# Patient Record
Sex: Male | Born: 1990 | Race: White | Hispanic: No | Marital: Married | Smoking: Current every day smoker
Health system: Southern US, Community
[De-identification: ages and names within clinical notes are randomized; demographics above are authoritative.]

## PROBLEM LIST (undated history)

## (undated) DIAGNOSIS — I10 Essential (primary) hypertension: Secondary | ICD-10-CM

## (undated) HISTORY — PX: WRIST SURGERY: SHX841

---
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
## 2011-03-27 ENCOUNTER — Encounter: Payer: Self-pay | Admitting: *Deleted

## 2011-03-27 DIAGNOSIS — E049 Nontoxic goiter, unspecified: Secondary | ICD-10-CM | POA: Insufficient documentation

## 2011-03-27 DIAGNOSIS — N62 Hypertrophy of breast: Secondary | ICD-10-CM | POA: Insufficient documentation

## 2011-04-20 ENCOUNTER — Ambulatory Visit: Payer: Self-pay | Admitting: "Endocrinology

## 2011-04-21 NOTE — Op Note (Signed)
[place]  Patient:    Daniel Ayers, Daniel Ayers Visit Number: [id number] MRN: [id number]          Service Type: DSU Location: [facility] Attending Physician:  Lucky Cowboy Dictated by:   Lucky Cowboy, M.D. Proc. Date: 08/02/01 Admit Date:  08/02/2001 Discharge Date: 08/02/2001   CC:         [facility]  Velvet Bathe, M.D.   Operative Report  PREOPERATIVE DIAGNOSIS:  Chronic otitis media with conductive hearing loss.  POSTOPERATIVE DIAGNOSIS: Chronic otitis media with conductive hearing loss. d  PROCEDURE:  Bilateral tympanotomy with tube placement.  SURGEON:  Lucky Cowboy, M.D.  ANESTHESIA:  General.  ESTIMATED BLOOD LOSS:  None.  COMPLICATIONS:  None.  INDICATIONS:  This patient is a 71-year-old male who has been diagnosed with ADHD.  In addition, he was found to have bilateral serous effusions with a moderate bilateral conductive hearing loss.  The air-bone gap was 30-40 dB. For these reasons, tympanotomy tubes are placed.  Central auditory processing testing will be deferred until resolution of the hearing test.  FINDINGS:  The patient was noted to have bilateral serous effusions with moderately retracted tympanic membranes and significant middle ear mucosal edema.  Activent 1.14 mm ID tubes were placed bilaterally.  DESCRIPTION OF PROCEDURE:  The patient was taken to the operating room and placed on the table in the supine position.  He was then placed under general mask anesthesia and a #4 ear speculum placed into the right external auditory canal.  With the aid of the operating microscope, cerumen was removed with the curet and suctioned.  Myringotomy knife was used to make an incision in the anterior inferior quadrant.  Middle ear fluid was evacuated and Activent tube placed through the tympanic membrane and secured in place with a pick.  Floxin otic drops were instilled.  Attention was then turned to the left ear.   In similar fashion, the cerumen was removed.  Myringotomy knife was used to make an incision in the anterior inferior quadrant.  Middle ear fluid was evacuated and Activent tube placed through the tympanic membrane and secured in place with a pick.  Floxin otic drops were instilled.  The patient was awakened from anesthesia and taken to the post anesthesia care unit in stable condition. There were no complications. Dictated by:   Lucky Cowboy, M.D. Attending Physician:  Lucky Cowboy DD:  08/02/01 TD:  08/02/01 Job: [id number]

## 2011-09-14 LAB — POCT RAPID STREP A: Streptococcus, Group A Screen (Direct): NEGATIVE

## 2017-03-27 DIAGNOSIS — R109 Unspecified abdominal pain: Secondary | ICD-10-CM | POA: Diagnosis not present

## 2019-11-06 ENCOUNTER — Other Ambulatory Visit: Payer: Self-pay

## 2019-11-06 DIAGNOSIS — Z20822 Contact with and (suspected) exposure to covid-19: Secondary | ICD-10-CM

## 2019-11-08 LAB — NOVEL CORONAVIRUS, NAA: SARS-CoV-2, NAA: NOT DETECTED

## 2019-12-15 ENCOUNTER — Other Ambulatory Visit: Payer: Self-pay

## 2019-12-15 ENCOUNTER — Emergency Department (HOSPITAL_COMMUNITY): Payer: 59

## 2019-12-15 ENCOUNTER — Emergency Department (HOSPITAL_COMMUNITY)
Admission: EM | Admit: 2019-12-15 | Discharge: 2019-12-15 | Disposition: A | Discharge status: Home / Self Care | Payer: 59 | Attending: Emergency Medicine | Admitting: Emergency Medicine

## 2019-12-15 ENCOUNTER — Encounter (HOSPITAL_COMMUNITY): Payer: Self-pay | Admitting: Emergency Medicine

## 2019-12-15 DIAGNOSIS — R05 Cough: Secondary | ICD-10-CM | POA: Insufficient documentation

## 2019-12-15 DIAGNOSIS — I10 Essential (primary) hypertension: Secondary | ICD-10-CM | POA: Insufficient documentation

## 2019-12-15 DIAGNOSIS — M549 Dorsalgia, unspecified: Secondary | ICD-10-CM

## 2019-12-15 DIAGNOSIS — M546 Pain in thoracic spine: Secondary | ICD-10-CM | POA: Insufficient documentation

## 2019-12-15 DIAGNOSIS — F17210 Nicotine dependence, cigarettes, uncomplicated: Secondary | ICD-10-CM | POA: Diagnosis not present

## 2019-12-15 HISTORY — DX: Essential (primary) hypertension: I10

## 2019-12-15 MED ORDER — METHOCARBAMOL 500 MG PO TABS: 500.0000 mg | ORAL_TABLET | Freq: Two times a day (BID) | ORAL | 0 refills | Status: AC | PRN

## 2019-12-15 MED ORDER — NAPROXEN 500 MG PO TABS
500.0000 mg | ORAL_TABLET | Freq: Two times a day (BID) | ORAL | 0 refills | Status: AC
Start: 2019-12-15 — End: ?

## 2019-12-15 MED ORDER — IBUPROFEN 600 MG PO TABS: 600.0000 mg | ORAL_TABLET | Freq: Four times a day (QID) | ORAL | 0 refills | Status: AC | PRN

## 2019-12-15 NOTE — ED Provider Notes (Signed)
[facility] EMERGENCY DEPARTMENT Provider Note   CSN: [id number] Arrival date & time: 12/15/19  2426     History Chief Complaint  Patient presents with  . Back Pain    Daniel Ayers is a 29 y.o. male.  HPI 29 year old male, presents with back pain which is been going on for approximately 4 days.  He denies any prior history of back injuries or problems, denies any current fevers and denies any neurologic symptoms.  He reports that he has been coughing for approximately 1 month intermittently, had some allergic rhinitis type symptoms, sneezing, coughing, seems to come and go but still present.  He does have known allergies.  He denies any prior problems with IV drug use, no recent tattoos, denies history of cancer, denies numbness or weakness of the legs, denies urinary symptoms including hematuria dysuria.  Has been applying warm compresses at his wife's request.   Past Medical History:  Diagnosis Date  . Hypertension     Patient Active Problem List   Diagnosis Date Noted  . Hypertrophy of breast 03/27/2011  . Goiter, unspecified 03/27/2011    Past Surgical History:  Procedure Laterality Date  . WRIST SURGERY         History reviewed. No pertinent family history.  Social History   Tobacco Use  . Smoking status: Current Every Day Smoker    Types: Cigarettes  . Smokeless tobacco: Never Used  Substance Use Topics  . Alcohol use: Yes    Alcohol/week: 6.0 standard drinks    Types: 6 Cans of beer per week  . Drug use: Never    Home Medications Prior to Admission medications   Medication Sig Start Date End Date Taking? Authorizing Provider  ibuprofen (ADVIL) 600 MG tablet Take 1 tablet (600 mg total) by mouth every 6 (six) hours as needed. 12/15/19   Noemi Chapel, MD  methocarbamol (ROBAXIN) 500 MG tablet Take 1 tablet (500 mg total) by mouth 2 (two) times daily as needed for muscle spasms. 12/15/19   Noemi Chapel, MD  naproxen (NAPROSYN) 500 MG tablet Take 1  tablet (500 mg total) by mouth 2 (two) times daily with a meal. 12/15/19   Noemi Chapel, MD    Allergies    Patient has no known allergies.  Review of Systems   Review of Systems  Constitutional: Negative for fever.  Respiratory: Positive for cough.   Neurological: Negative for weakness and numbness.    Physical Exam Updated Vital Signs BP 126/90 (BP Location: Right Arm)   Pulse 76   Temp 98.1 F (36.7 C) (Oral)   Resp 20   Ht 1.778 m (5\' 10" )   Wt 90.7 kg   SpO2 99%   BMI 28.70 kg/m   Physical Exam Vitals and nursing note reviewed.  Constitutional:      General: He is not in acute distress.    Appearance: He is well-developed.  HENT:     Head: Normocephalic and atraumatic.     Mouth/Throat:     Pharynx: No oropharyngeal exudate.  Eyes:     General: No scleral icterus.       Right eye: No discharge.        Left eye: No discharge.     Conjunctiva/sclera: Conjunctivae normal.     Pupils: Pupils are equal, round, and reactive to light.  Neck:     Thyroid: No thyromegaly.     Vascular: No JVD.  Cardiovascular:     Rate and Rhythm: Normal rate and  regular rhythm.     Heart sounds: Normal heart sounds. No murmur. No friction rub. No gallop.   Pulmonary:     Effort: Pulmonary effort is normal. No respiratory distress.     Breath sounds: Normal breath sounds. No wheezing or rales.     Comments: Rales at the right base that clear with coughing, otherwise clear and no respiratory distress, no increased work of breathing, speaks in full sentences Abdominal:     General: Bowel sounds are normal. There is no distension.     Palpations: Abdomen is soft. There is no mass.     Tenderness: There is no abdominal tenderness.  Musculoskeletal:        General: No tenderness. Normal range of motion.     Cervical back: Normal range of motion and neck supple.     Comments: No spinal tenderness, mild right CVA tenderness  Lymphadenopathy:     Cervical: No cervical adenopathy.    Skin:    General: Skin is warm and dry.     Findings: No erythema or rash.  Neurological:     Mental Status: He is alert.     Coordination: Coordination normal.     Comments: Speech is clear, cranial nerves III through XII are intact, memory is intact, strength is normal in all 4 extremities including grips, sensation is intact to light touch and pinprick in all 4 extremities. Coordination as tested by finger-nose-finger is normal, no limb ataxia. Normal gait, normal reflexes at the patellar tendons bilaterally  Psychiatric:        Behavior: Behavior normal.     ED Results / Procedures / Treatments   Labs (all labs ordered are listed, but only abnormal results are displayed) Labs Reviewed - No data to display  EKG None  Radiology DG Chest 2 View  Result Date: 12/15/2019 CLINICAL DATA:  Cough EXAM: CHEST - 2 VIEW COMPARISON:  None. FINDINGS: Lungs are clear. Heart size and pulmonary vascularity are normal. No adenopathy. No bone lesions. IMPRESSION: Lungs clear.  No adenopathy. Electronically Signed   By: Bretta Bang III M.D.   On: 12/15/2019 07:57    Procedures Procedures (including critical care time)  Medications Ordered in ED Medications - No data to display  ED Course  I have reviewed the triage vital signs and the nursing notes.  Pertinent labs & imaging results that were available during my care of the patient were reviewed by me and considered in my medical decision making (see chart for details).    MDM Rules/Calculators/A&P                      No neurologic findings, this patient has likely nonpathological back pain.  X-ray of the chest I have personally viewed and interpreted, 2 view PA and lateral with normal lung windows, there is no signs of infiltrate pneumothorax subdiaphragmatic air or abnormal mediastinum.  This is a normal-appearing x-ray.  This does not appear to be pneumonia, it does not appear to be cauda equina or other spinal pathology, no  risk for epidural abscess and he is afebrile.  We will treat with medications such as ibuprofen and Robaxin, the patient can follow-up in the outpatient setting  The patient was instructed on indications for follow-up, treatment plan, stable for discharge  Final Clinical Impression(s) / ED Diagnoses Final diagnoses:  Mid back pain on right side    Rx / DC Orders ED Discharge Orders  Ordered    naproxen (NAPROSYN) 500 MG tablet  2 times daily with meals     12/15/19 0802    ibuprofen (ADVIL) 600 MG tablet  Every 6 hours PRN     12/15/19 0802    methocarbamol (ROBAXIN) 500 MG tablet  2 times daily PRN     12/15/19 0804           Eber Hong, MD 12/15/19 [phone]

## 2019-12-15 NOTE — ED Triage Notes (Signed)
C/o mid back pain.  Coughing (non-productive) for one month.  Tested negative for COVID one month ago.  Rates pain 2/10 sitting, but increases to 7/10 with movement.  Denies any injury.

## 2019-12-15 NOTE — Discharge Instructions (Signed)
Back Pain: ° ° °Your back pain should be treated with medicines such as ibuprofen or aleve and this back pain should get better over the next 2 weeks.  However if you develop severe or worsening pain, low back pain with fever, numbness, weakness or inability to walk or urinate, you should return to the ER immediately.  Please follow up with your doctor this week for a recheck if still having symptoms. °Low back pain is discomfort in the lower back that may be due to injuries to muscles and ligaments around the spine.  Occasionally, it may be caused by a a problem to a part of the spine called a disc.  The pain may last several days or a week;  However, most patients get completely well in 4 weeks. ° °Self - care:  The application of heat can help soothe the pain.  Maintaining your daily activities, including walking, is encourged, as it will help you get better faster than just staying in bed. ° °Medications are also useful to help with pain control.  A commonly prescribed medications includes acetaminophen.  This medication is generally safe, though you should not take more than 8 of the extra strength (500mg) pills a day. ° °Non steroidal anti inflammatory medications including Ibuprofen and naproxen;  These medications help both pain and swelling and are very useful in treating back pain.  They should be taken with food, as they can cause stomach upset, and more seriously, stomach bleeding.   ° °Muscle relaxants:  These medications can help with muscle tightness that is a cause of lower back pain.  Most of these medications can cause drowsiness, and it is not safe to drive or use dangerous machinery while taking them. ° °You will need to follow up with  Your primary healthcare provider in 1-2 weeks for reassessment. ° °Be aware that if you develop new symptoms, such as a fever, leg weakness, difficulty with or loss of control of your urine or bowels, abdominal pain, or more severe pain, you will need to seek  medical attention and  / or return to the Emergency department. ° °If you do not have a doctor see the list below. ° °RESOURCE GUIDE ° °Chronic Pain Problems: °Contact [facility]  297-2271 °Patients need to be referred by their primary care doctor. ° °Insufficient Money for Medicine: °Contact [employer] Way:  call "211" or [facility] 271-5999. ° °No Primary Care Doctor: °- Call [facility]  832-8000 - can help you locate a primary care doctor that  accepts your insurance, provides certain services, etc. °- Physician Referral Service- 1-800-533-3463 ° °Agencies that provide inexpensive medical care: °- [facility]  832-8035 °- [facility]  832-7272 °- [facility]  [phone] °- [facility]  832-4777 °- [facility]  373-0678 °- [facility]  272-1050 ° °Medicaid-accepting [place] Providers: °- [facility]- [address] ° [phone], Mon-Fri 9am-7pm, Sat 9am-1pm °- [facility]- [address], [address] ° [phone] °- [facility]- [address], [address] ° [phone] °- [facility]- [address] [place] [address] ° [phone] °- [facility]- [address], [address], [phone] ° Only accepts [place] Access Medicaid patients after they have their name  applied to their card ° °Self Pay (no insurance) in [place]: °- Sickle Cell Patients: Dr Eric Dean, [facility] °   [address], [phone] °- [facility]- [address] ° [phone] °      -     [facility] [facility]- [address] °      -     [facility]- see information above (Speak to Pam H if you do not have insurance) °      -  [facility]- [address], [phone] °      -  [facility] [place]- [address],  [phone] °      -  [facility]- [address] [place] [address],  [phone] °      -  Dr Osei-Bonsu-  [address], [place], [phone] °      -  [facility]- [address], [phone] °      -  [facility]- [address] [place] [address], [phone], also [address], [phone] °      -    [facility]- [address], [phone], 1st & 3rd Saturday   every month, 10am-1pm ° °1) Find a Doctor and Pay Out of Pocket °Although you won't have to find out who is covered by your insurance plan, it is a good idea to ask around and get recommendations. You will then need to call the office and see if the doctor you have chosen will accept you as a new patient and what types of options they offer for patients who are self-pay. Some doctors offer discounts or will set up payment plans for their patients who do not have insurance, but you will need to ask so you aren't surprised when you get to your appointment. ° °2) Contact Your Local Health Department °Not all health departments have doctors that can see patients for sick visits, but many do, so it is worth a call to see if yours does. If you don't know where your local health department is, you can check in your phone book. The CDC also has a tool to help you locate your state's health department, and many state websites also have listings of all of their local health departments. ° °3) Find a Walk-in Clinic °If your illness is not likely to be very severe or complicated, you may want to try a walk in clinic. These are popping up all over the country in pharmacies, drugstores, and shopping centers. They're usually staffed by nurse practitioners or physician assistants that have been trained to treat common illnesses and complaints. They're usually fairly quick and inexpensive. However, if you have serious medical issues or chronic medical problems, these are probably not your best option ° °STD Testing °- [place] [facility] [place], [facility], [address], [place], phone [phone] or 1-877-539-9860.  Monday - Friday, call for an appointment. °- [place] [facility] [place], [facility], [address], [place], phone [phone] or 1-877-539-9860.  Monday - Friday, call for an appointment. ° °Abuse/Neglect: °- [place] Child Abuse Hotline (336) 641-3795 °- [place] Child Abuse Hotline 800-378-5315 (After Hours) ° °Emergency Shelter:  [facility] (336) 271-5985 ° °Maternity Homes: °- [facility] (336) 275-9566 °- [facility] (704) 372-4663 ° °MRSA Hotline #:   832-7006 ° °[place] Resources ° °[facility] [place]  [employer] [place] [facility] °[address]                 [address]         [address]  °[place]                                               [place]                              [place] °Phone:  349-3220                                  Phone:  [phone]                   Phone:  342-8140 ° °[facility], 342-8316 °- [place] [facility] - [facility]- 1-888-581-9988 °      -     [facility] in [place], [address],                                  336-349-4454, Insurance ° °[place] Child Abuse Hotline °(336) 342-1394 or (336) 342-3537 (After Hours) ° ° °Behavioral Health Services ° °Substance Abuse Resources: °- [facility]  336-882-2125 °- [facility] 336-784-9470 °- [facility] 336-285-9073 °- [facility] 336-845-3988 °- Residential & Outpatient Substance Abuse Program  800-659-3381 ° °Psychological Services: °- [facility]  832-9600 °- [facility]  378-7881 °- [facility], [address], [place], ACCESS LINE: 1-800-853-5163 or 336-641-4981, Http://www.[facility].com/services/adult.htm ° °Dental Assistance ° °If unable to pay or  uninsured, contact:  [facility] or [facility] to become qualified for the adult dental clinic. ° °Patients with Medicaid: [facility] °[address], [phone] °[address], [phone] ° °If unable to pay, or uninsured, contact [facility] (271-5999) or [place] [facility] (641-3152 in [place], 842-7733 in [place]) to become qualified for the adult dental clinic ° °Other Low-Cost Community Dental Services: °- [facility]- [address], [place], [place], [address], 723-1848, Ext. [address], 2nd and 4th Thursday of the month at 6:30am.  10 clients each day by appointment, can sometimes see walk-in patients if someone does not show for an appointment. °- [facility]- [address], [place], [place], [address], 723-7904 °- [facility]- [address], [place], [place], [address], 631-2330 °- [facility]- 342-8273 °- [facility]- 703-3100 °- [facility]- 570-6415 ° ° ° ° ° ° °

## 2020-01-06 ENCOUNTER — Other Ambulatory Visit: Payer: Self-pay | Admitting: Physician Assistant

## 2020-01-06 DIAGNOSIS — N62 Hypertrophy of breast: Secondary | ICD-10-CM

## 2020-01-14 ENCOUNTER — Other Ambulatory Visit: Payer: 59

## 2020-01-27 ENCOUNTER — Other Ambulatory Visit: Payer: 59

## 2020-03-04 ENCOUNTER — Ambulatory Visit (INDEPENDENT_AMBULATORY_CARE_PROVIDER_SITE_OTHER): Payer: 59

## 2020-03-04 ENCOUNTER — Other Ambulatory Visit: Payer: Self-pay

## 2020-03-04 ENCOUNTER — Ambulatory Visit
Admission: EM | Admit: 2020-03-04 | Discharge: 2020-03-04 | Disposition: A | Discharge status: Home / Self Care | Payer: 59 | Attending: Emergency Medicine | Admitting: Emergency Medicine

## 2020-03-04 DIAGNOSIS — M549 Dorsalgia, unspecified: Secondary | ICD-10-CM | POA: Diagnosis not present

## 2020-03-04 DIAGNOSIS — W07XXXA Fall from chair, initial encounter: Secondary | ICD-10-CM | POA: Diagnosis not present

## 2020-03-04 DIAGNOSIS — R0781 Pleurodynia: Secondary | ICD-10-CM | POA: Diagnosis not present

## 2020-03-04 NOTE — ED Triage Notes (Signed)
Pt fell on left ribs last week, pain with inspiration

## 2020-03-04 NOTE — Discharge Instructions (Addendum)
Your costovertebral angle pain, chest pain are more related to the fall Patient was advised to use OTC Tylenol/ibuprofen for pain management To return or go to ED for new or worsening symptoms

## 2020-03-04 NOTE — ED Provider Notes (Signed)
[facility]    CSN: [id number] Arrival date & time: 03/04/20  0854      History   Chief Complaint No chief complaint on file.   HPI Daniel Ayers is a 29 y.o. male.   Who presented to the urgent care with a complaint of left rib cage pain for the past week.  Report he fell from a 3 foot high chair and landed on his left side around his rib cage.  Localized the pain to the left costovertebral angle and chest.  He describes the pain as constant and achy and rated at 5 on inspiration.  Has not tried any OTC medication.  His symptoms are made worse with breathing.  He denies similar symptoms in the past.  Denies chills, fever, nausea, vomiting, diarrhea, shortness of breath, bloody sputum, chest tightness.   The history is provided by the patient. No language interpreter was used.    Past Medical History:  Diagnosis Date  . Hypertension     Patient Active Problem List   Diagnosis Date Noted  . Hypertrophy of breast 03/27/2011  . Goiter, unspecified 03/27/2011    Past Surgical History:  Procedure Laterality Date  . WRIST SURGERY         Home Medications    Prior to Admission medications   Medication Sig Start Date End Date Taking? Authorizing Provider  ibuprofen (ADVIL) 600 MG tablet Take 1 tablet (600 mg total) by mouth every 6 (six) hours as needed. 12/15/19   Eber Hong, MD  methocarbamol (ROBAXIN) 500 MG tablet Take 1 tablet (500 mg total) by mouth 2 (two) times daily as needed for muscle spasms. 12/15/19   Eber Hong, MD  naproxen (NAPROSYN) 500 MG tablet Take 1 tablet (500 mg total) by mouth 2 (two) times daily with a meal. 12/15/19   Eber Hong, MD    Family History Family History  Family history unknown: Yes    Social History Social History   Tobacco Use  . Smoking status: Current Every Day Smoker    Types: Cigarettes  . Smokeless tobacco: Never Used  Substance Use Topics  . Alcohol use: Yes    Alcohol/week: 6.0 standard drinks   Types: 6 Cans of beer per week  . Drug use: Never     Allergies   Patient has no known allergies.   Review of Systems Review of Systems  Constitutional: Negative.   Respiratory: Negative.   Cardiovascular: Positive for chest pain.       Costovertebral angle pain   Gastrointestinal: Negative.   Genitourinary: Positive for flank pain.  All other systems reviewed and are negative.    Physical Exam Triage Vital Signs ED Triage Vitals [03/04/20 0903]  Enc Vitals Group     BP 122/76     Pulse Rate 75     Resp 17     Temp 98.2 F (36.8 C)     Temp Source Oral     SpO2 98 %     Weight      Height      Head Circumference      Peak Flow      Pain Score      Pain Loc      Pain Edu?      Excl. in GC?    No data found.  Updated Vital Signs BP 122/76 (BP Location: Right Arm)   Pulse 75   Temp 98.2 F (36.8 C) (Oral)   Resp 17   SpO2  98%   Visual Acuity Right Eye Distance:   Left Eye Distance:   Bilateral Distance:    Right Eye Near:   Left Eye Near:    Bilateral Near:     Physical Exam Vitals and nursing note reviewed.  Constitutional:      General: He is not in acute distress.    Appearance: Normal appearance. He is normal weight. He is not ill-appearing or toxic-appearing.  HENT:     Head: Normocephalic.     Right Ear: Tympanic membrane, ear canal and external ear normal. There is no impacted cerumen.     Left Ear: Tympanic membrane, ear canal and external ear normal. There is no impacted cerumen.  Cardiovascular:     Rate and Rhythm: Normal rate and regular rhythm.     Pulses: Normal pulses.     Heart sounds: Normal heart sounds. No murmur. No gallop.   Pulmonary:     Effort: Pulmonary effort is normal. No respiratory distress.     Breath sounds: Normal breath sounds. No stridor. No wheezing, rhonchi or rales.  Chest:     Chest wall: No tenderness.  Abdominal:     General: Abdomen is flat. There is no distension.     Palpations: Abdomen is soft.  There is no mass.     Tenderness: There is no abdominal tenderness. There is left CVA tenderness. There is no right CVA tenderness.  Musculoskeletal:        General: Tenderness present. Normal range of motion.  Neurological:     Mental Status: He is alert.      UC Treatments / Results  Labs (all labs ordered are listed, but only abnormal results are displayed) Labs Reviewed - No data to display  EKG   Radiology DG Ribs Unilateral W/Chest Left  Result Date: 03/04/2020 CLINICAL DATA:  Larey Seat 1 week ago and injured left ribs. Persistent pain. EXAM: LEFT RIBS AND CHEST - 3+ VIEW COMPARISON:  None. FINDINGS: The cardiac silhouette, mediastinal and hilar contours are within normal limits. The lungs are clear. No pleural effusion or pneumothorax. Suspect subtle nondisplaced fracture involving the eighth anterior rib. No other definite rib fractures are identified. IMPRESSION: Suspect subtle nondisplaced left eighth anterior rib fracture. No acute cardiopulmonary findings. Electronically Signed   By: Rudie Meyer M.D.   On: 03/04/2020 09:27    Procedures Procedures (including critical care time)  Medications Ordered in UC Medications - No data to display  Initial Impression / Assessment and Plan / UC Course  I have reviewed the triage vital signs and the nursing notes.  Pertinent labs & imaging results that were available during my care of the patient were reviewed by me and considered in my medical decision making (see chart for details).     Patient is stable at discharge.  Chest x-ray is negative for bony abnormality including fracture or dislocation.  I have reviewed the x-ray myself and the radiologist interpretation.  I am in agreement with the radiologist interpretation.  Costovertebral angle pain, chest pain and flank pain are more related to the fall.  Patient was advised to take ibuprofen for pain management. To return or go to ED for worsening of symptoms  Final Clinical  Impressions(s) / UC Diagnoses   Final diagnoses:  Pain of costovertebral angle  Fall from chair, initial encounter     Discharge Instructions     Positive vertebral angle pain, chest pain are more related to the fall Patient was advised to use OTC  Tylenol/ibuprofen for pain management To return or go to ED for new or worsening symptoms    ED Prescriptions    None     PDMP not reviewed this encounter.   Emerson Monte, [place] 03/04/20 [phone]

## 2020-05-29 ENCOUNTER — Emergency Department (HOSPITAL_COMMUNITY): Payer: 59

## 2020-05-29 ENCOUNTER — Emergency Department (HOSPITAL_COMMUNITY)
Admission: EM | Admit: 2020-05-29 | Discharge: 2020-05-29 | Disposition: A | Discharge status: Home / Self Care | Payer: 59 | Attending: Emergency Medicine | Admitting: Emergency Medicine

## 2020-05-29 ENCOUNTER — Other Ambulatory Visit: Payer: Self-pay

## 2020-05-29 ENCOUNTER — Encounter (HOSPITAL_COMMUNITY): Payer: Self-pay

## 2020-05-29 DIAGNOSIS — Y9289 Other specified places as the place of occurrence of the external cause: Secondary | ICD-10-CM | POA: Insufficient documentation

## 2020-05-29 DIAGNOSIS — T2027XA Burn of second degree of neck, initial encounter: Secondary | ICD-10-CM | POA: Diagnosis not present

## 2020-05-29 DIAGNOSIS — T2020XA Burn of second degree of head, face, and neck, unspecified site, initial encounter: Secondary | ICD-10-CM | POA: Diagnosis not present

## 2020-05-29 DIAGNOSIS — I10 Essential (primary) hypertension: Secondary | ICD-10-CM | POA: Diagnosis not present

## 2020-05-29 DIAGNOSIS — F17210 Nicotine dependence, cigarettes, uncomplicated: Secondary | ICD-10-CM | POA: Insufficient documentation

## 2020-05-29 DIAGNOSIS — X010XXA Exposure to flames in uncontrolled fire, not in building or structure, initial encounter: Secondary | ICD-10-CM | POA: Insufficient documentation

## 2020-05-29 DIAGNOSIS — Z79899 Other long term (current) drug therapy: Secondary | ICD-10-CM | POA: Diagnosis not present

## 2020-05-29 DIAGNOSIS — Y9389 Activity, other specified: Secondary | ICD-10-CM | POA: Insufficient documentation

## 2020-05-29 DIAGNOSIS — Y998 Other external cause status: Secondary | ICD-10-CM | POA: Insufficient documentation

## 2020-05-29 MED ORDER — BACITRACIN ZINC 500 UNIT/GM EX OINT: TOPICAL_OINTMENT | Freq: Once | CUTANEOUS | Status: DC

## 2020-05-29 MED ORDER — SILVER SULFADIAZINE 1 % EX CREA
TOPICAL_CREAM | Freq: Once | CUTANEOUS | Status: AC
  Administered 2020-05-29: 1 via TOPICAL

## 2020-05-29 MED ORDER — TRAMADOL HCL 50 MG PO TABS: 100.0000 mg | ORAL_TABLET | Freq: Four times a day (QID) | ORAL | 0 refills | Status: AC | PRN

## 2020-05-29 MED ORDER — BACITRACIN ZINC 500 UNIT/GM EX OINT
TOPICAL_OINTMENT | Freq: Once | CUTANEOUS | Status: AC
  Administered 2020-05-29: 1 via TOPICAL

## 2020-05-29 MED ORDER — TRAMADOL HCL 50 MG PO TABS: 100.0000 mg | ORAL_TABLET | Freq: Four times a day (QID) | ORAL | 0 refills | Status: DC | PRN

## 2020-05-29 MED ORDER — KETOROLAC TROMETHAMINE 30 MG/ML IJ SOLN
30.0000 mg | Freq: Once | INTRAMUSCULAR | Status: AC
  Administered 2020-05-29: 30 mg via INTRAVENOUS

## 2020-05-29 MED ORDER — FENTANYL CITRATE (PF) 100 MCG/2ML IJ SOLN
50.0000 ug | Freq: Once | INTRAMUSCULAR | Status: AC
  Administered 2020-05-29: 50 ug via INTRAVENOUS
  Filled 2020-05-29: qty 2

## 2020-05-29 MED ORDER — KETOROLAC TROMETHAMINE 30 MG/ML IJ SOLN: 30.0000 mg | Freq: Once | INTRAMUSCULAR | Status: DC

## 2020-05-29 NOTE — Discharge Instructions (Addendum)
Take ibuprofen 600 mg 4 times a day with tramadol 100 mg plus acetaminophen 1000 mg 4 times a day for pain.  You need to sleep and sit with your head elevated, if you lay flat you will get more swelling in your face.  All burns have swelling within the first 24 to 48 hours and it can last a couple of days.  You can expect your eyes to be swollen shut.  However as long as you are able to swallow and breathe okay you will be fine.  Return to the ED if you get fever, cough, or feel short of breath.  Wash your face 3 times a day then use bacitracin, not Neosporin on your face and use the Silvadene on your neck until gone.  When it is gone use the bacitracin on your neck also.  You can have your doctor recheck you in a couple days if you are concerned that you are not healing properly.
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
## 2021-01-26 IMAGING — DX DG CHEST 2V
2 series · 2 of 2 positions shown · non-contrast
Comparison: None.

CLINICAL DATA: Cough

EXAM:
CHEST - 2 VIEW

[chest pa]
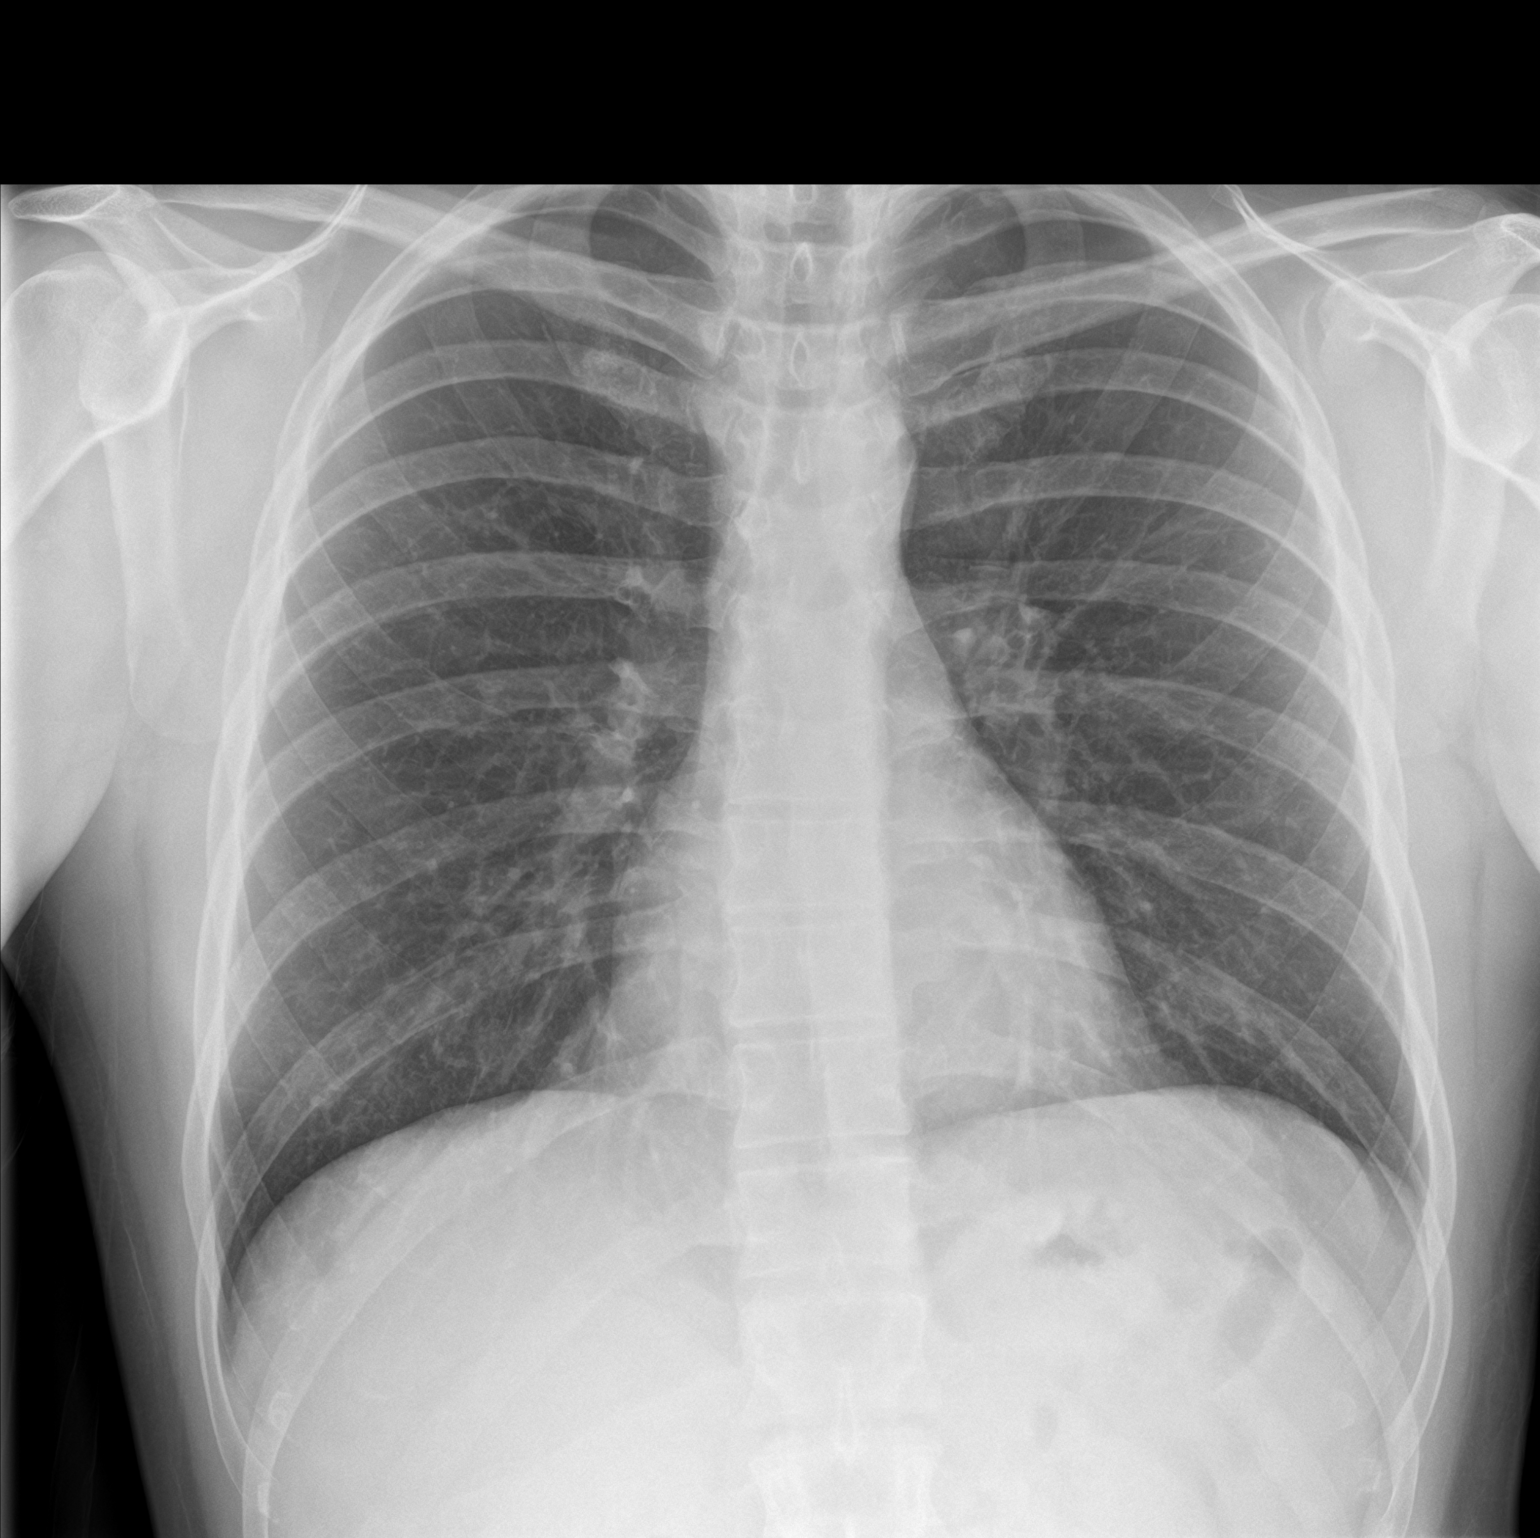

[chest lat]
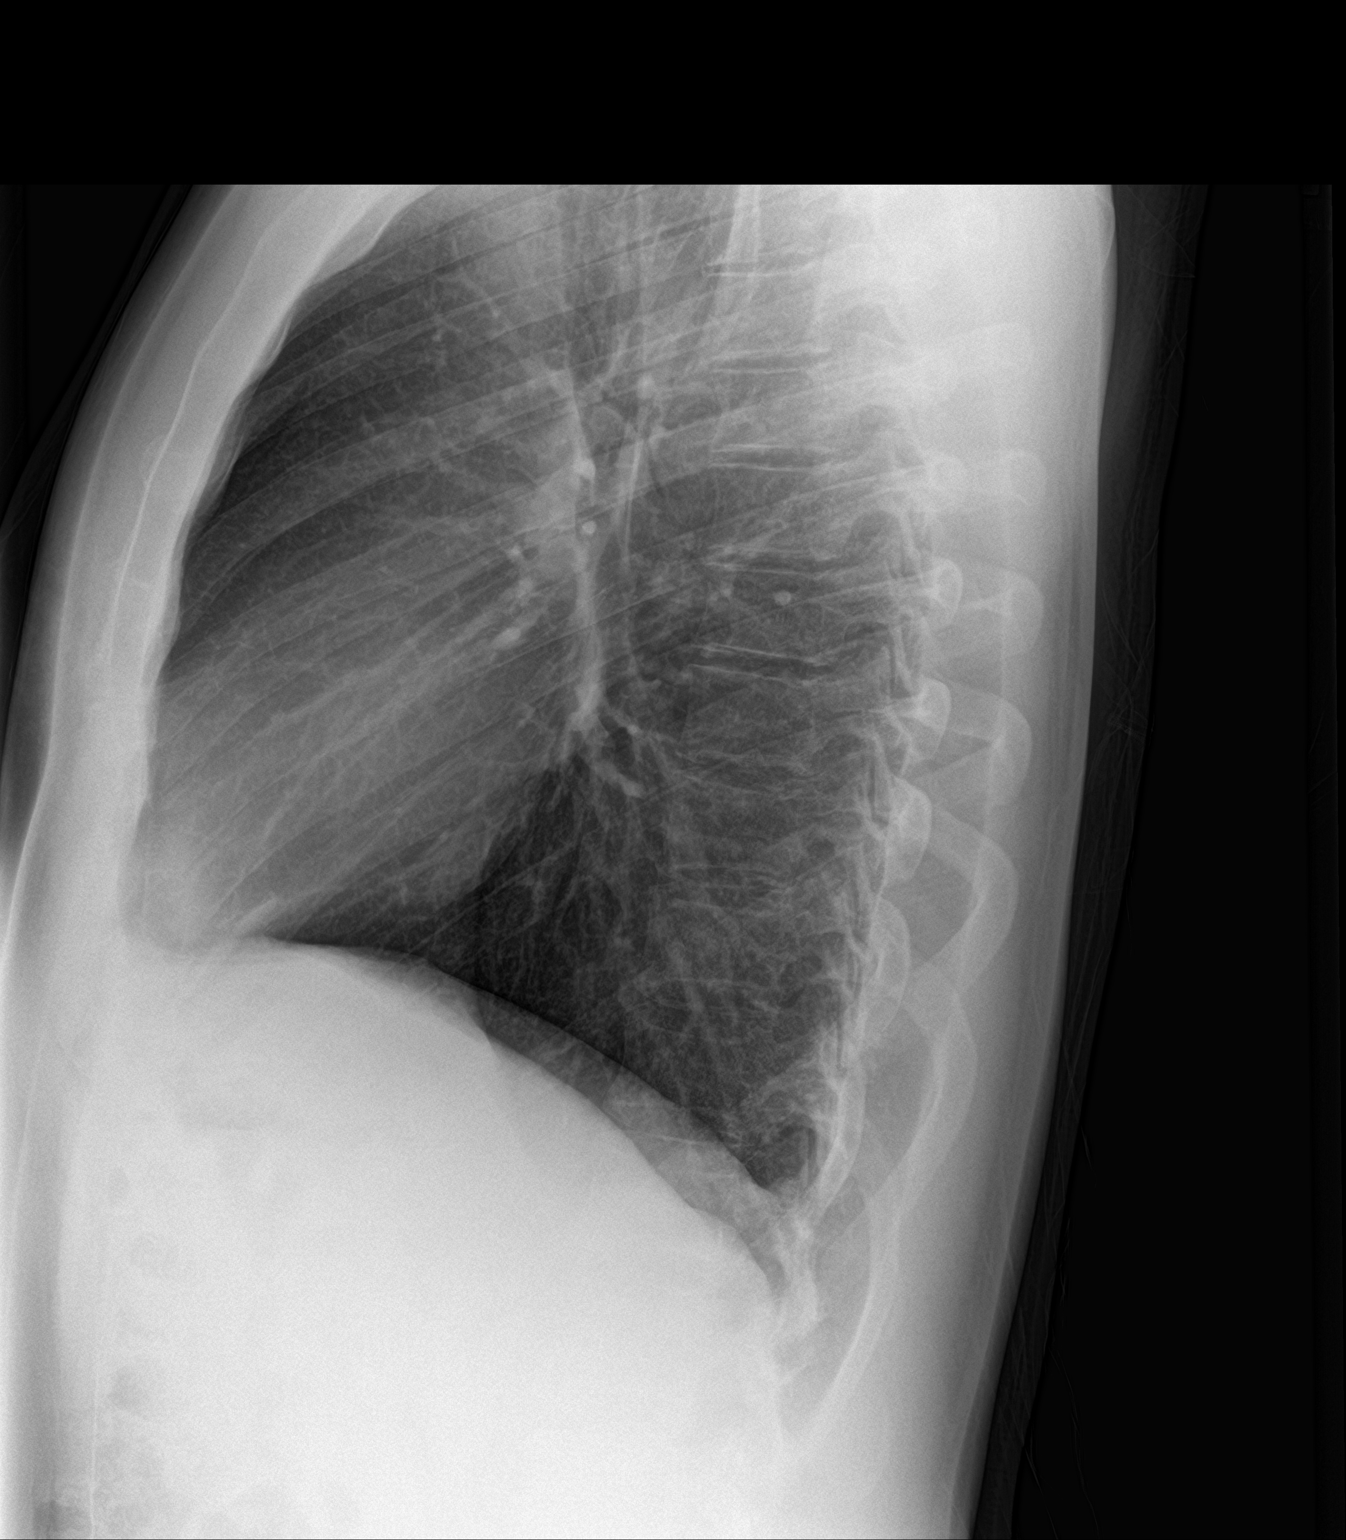

[2 of 2 positions shown; findings below may reference images not displayed]

FINDINGS: Lungs are clear. Heart size and pulmonary vascularity are normal. No
adenopathy. No bone lesions.
IMPRESSION: Lungs clear.  No adenopathy.
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
## 2022-01-01 ENCOUNTER — Emergency Department (HOSPITAL_COMMUNITY)
Admission: EM | Admit: 2022-01-01 | Discharge: 2022-01-01 | Disposition: A | Discharge status: Home / Self Care | Payer: 59 | Attending: Emergency Medicine | Admitting: Emergency Medicine

## 2022-01-01 ENCOUNTER — Other Ambulatory Visit: Payer: Self-pay

## 2022-01-01 ENCOUNTER — Encounter (HOSPITAL_COMMUNITY): Payer: Self-pay | Admitting: Emergency Medicine

## 2022-01-01 DIAGNOSIS — L539 Erythematous condition, unspecified: Secondary | ICD-10-CM | POA: Diagnosis present

## 2022-01-01 DIAGNOSIS — H1820 Unspecified corneal edema: Secondary | ICD-10-CM | POA: Diagnosis not present

## 2022-01-01 MED ORDER — FLUORESCEIN SODIUM 1 MG OP STRP
1.0000 | ORAL_STRIP | Freq: Once | OPHTHALMIC | Status: AC
  Administered 2022-01-01: 1 via OPHTHALMIC
  Filled 2022-01-01: qty 1

## 2022-01-01 MED ORDER — TETRACAINE HCL 0.5 % OP SOLN
2.0000 [drp] | Freq: Once | OPHTHALMIC | Status: AC
  Administered 2022-01-01: 2 [drp] via OPHTHALMIC
  Filled 2022-01-01: qty 4

## 2022-01-01 NOTE — ED Triage Notes (Signed)
Pt to the ED with complaints of a left eye problem after recovering from conjunctivitis.  Pt appears to have a fluid filled sac in his left eye.

## 2022-01-01 NOTE — Discharge Instructions (Addendum)
You are scheduled to be seen tomorrow afternoon at [facility] in [place], [place] on 01/02/21 tomorrow at 12:30 pm to have your eye evaluated.
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
